# Patient Record
Sex: Female | Born: 1995 | Race: Black or African American | Hispanic: No | Marital: Single | Smoking: Never smoker
Health system: Southern US, Community
[De-identification: ages and names within clinical notes are randomized; demographics above are authoritative.]

## PROBLEM LIST (undated history)

## (undated) DIAGNOSIS — G43909 Migraine, unspecified, not intractable, without status migrainosus: Secondary | ICD-10-CM

---

## 2014-07-25 ENCOUNTER — Emergency Department: Payer: Self-pay | Admitting: Emergency Medicine

## 2016-10-01 ENCOUNTER — Emergency Department
Admission: EM | Admit: 2016-10-01 | Discharge: 2016-10-01 | Disposition: A | Discharge status: Home / Self Care | Payer: 59 | Attending: Emergency Medicine | Admitting: Emergency Medicine

## 2016-10-01 ENCOUNTER — Encounter: Payer: Self-pay | Admitting: Emergency Medicine

## 2016-10-01 DIAGNOSIS — M5431 Sciatica, right side: Secondary | ICD-10-CM | POA: Diagnosis not present

## 2016-10-01 DIAGNOSIS — M545 Low back pain: Secondary | ICD-10-CM | POA: Diagnosis present

## 2016-10-01 MED ORDER — PREDNISONE 10 MG (21) PO TBPK: ORAL_TABLET | ORAL | 0 refills | Status: DC

## 2016-10-01 MED ORDER — HYDROCODONE-ACETAMINOPHEN 5-325 MG PO TABS: 1.0000 | ORAL_TABLET | ORAL | 0 refills | Status: AC | PRN

## 2016-10-01 NOTE — ED Provider Notes (Signed)
[facility] Emergency Department Provider Note ____________________________________________  Time seen: Approximately 10:16 AM  I have reviewed the triage vital signs and the nursing notes.   HISTORY  Chief Complaint Back Pain and Leg Pain    HPI Diane Khan is a 21 y.o. female who presents to the emergency department for evaluation of pain to the right lower back that radiates down into the right leg. She denies any recent injury. She states that he she has been traveling long hours over the past several days. She's had no relief with Tylenol and ibuprofen. She denies symptoms similar to these in the past.  History reviewed. No pertinent past medical history.  There are no active problems to display for this patient.   History reviewed. No pertinent surgical history.  Prior to Admission medications   Medication Sig Start Date End Date Taking? Authorizing Provider  HYDROcodone-acetaminophen (NORCO/VICODIN) 5-325 MG tablet Take 1 tablet by mouth every 4 (four) hours as needed for moderate pain. 10/01/16 10/01/17  Rhyder Bratz, Kasandra Knudsen, FNP  predniSONE (STERAPRED UNI-PAK 21 TAB) 10 MG (21) TBPK tablet Take 6 tablets on day 1 Take 5 tablets on day 2 Take 4 tablets on day 3 Take 3 tablets on day 4 Take 2 tablets on day 5 Take 1 tablet on day 6 10/01/16   Emmy Keng B, FNP    Allergies Sulfa antibiotics  No family history on file.  Social History Social History  Substance Use Topics  . Smoking status: Never Smoker  . Smokeless tobacco: Never Used  . Alcohol use No    Review of Systems Constitutional: Uncomfortable appearing Respiratory: Negative for cough or shortness of breath. Musculoskeletal: Positive for pain in the right lower back. Skin: Negative for rash, lesion, or wound.  Neurological: Positive for radiation of pain into the right lower extremity.  ____________________________________________   PHYSICAL EXAM:  VITAL SIGNS: ED  Triage Vitals  Enc Vitals Group     BP      Pulse      Resp      Temp      Temp src      SpO2      Weight      Height      Head Circumference      Peak Flow      Pain Score      Pain Loc      Pain Edu?      Excl. in GC?     Constitutional: Alert and oriented. Well appearing and in no acute distress. Eyes: Conjunctiva clear without drainage or discharge.  Head: Atraumatic Neck: No stridor. Respiratory: Respirations are even and unlabored. Musculoskeletal: Positive straight leg raise on the right. No focal, midline tenderness of the lumbar spine. Neurologic: Radicular pain to the posterior aspect of the right leg to the knee.  Skin: Atraumatic.  Psychiatric: Normal affect and behavior.  ____________________________________________   LABS (all labs ordered are listed, but only abnormal results are displayed)  Labs Reviewed - No data to display ____________________________________________  RADIOLOGY  Not indicated. ____________________________________________   PROCEDURES  Procedure(s) performed: None  ____________________________________________   INITIAL IMPRESSION / ASSESSMENT AND PLAN / ED COURSE  Diane Khan is a 21 y.o. female who presents to the emergency department for evaluation of lower back pain that radiates into the right lower extremity. Symptoms are consistent with sciatica. She'll be treated with Norco and prednisone. She reportedly has some Flexeril at home. She was advised to follow-up  with her primary care provider for symptoms that are not improving over the next week or so. She was instructed to return to the emergency department for symptoms that change or worsen if she is unable schedule an appointment.  Pertinent labs & imaging results that were available during my care of the patient were reviewed by me and considered in my medical decision making (see chart for details).  _________________________________________   FINAL CLINICAL  IMPRESSION(S) / ED DIAGNOSES  Final diagnoses:  Sciatica of right side    Discharge Medication List as of 10/01/2016 10:26 AM    START taking these medications   Details  HYDROcodone-acetaminophen (NORCO/VICODIN) 5-325 MG tablet Take 1 tablet by mouth every 4 (four) hours as needed for moderate pain., Starting Wed 10/01/2016, Until Thu 10/01/2017, Print    predniSONE (STERAPRED UNI-PAK 21 TAB) 10 MG (21) TBPK tablet Take 6 tablets on day 1 Take 5 tablets on day 2 Take 4 tablets on day 3 Take 3 tablets on day 4 Take 2 tablets on day 5 Take 1 tablet on day 6, Print        If controlled substance prescribed during this visit, 12 month history viewed on the [facility] prior to issuing an initial prescription for Schedule II or III opiod.    Chinita Pesterriplett, Jed Kutch B, FNP 10/04/16 [id number]    Minna AntisPaduchowski, Kevin, MD 10/05/16 1416

## 2016-10-01 NOTE — ED Triage Notes (Signed)
States she developed right lower back several days ago  States pain radiates into right leg  Denies any trauma  But may have twisted wrong  Ambulates well to treatment room

## 2018-07-03 ENCOUNTER — Encounter: Payer: Self-pay | Admitting: Gynecology

## 2018-07-03 ENCOUNTER — Other Ambulatory Visit: Payer: Self-pay

## 2018-07-03 ENCOUNTER — Ambulatory Visit
Admission: EM | Admit: 2018-07-03 | Discharge: 2018-07-03 | Disposition: A | Discharge status: Home / Self Care | Payer: BLUE CROSS/BLUE SHIELD | Attending: Family Medicine | Admitting: Family Medicine

## 2018-07-03 DIAGNOSIS — L03116 Cellulitis of left lower limb: Secondary | ICD-10-CM

## 2018-07-03 DIAGNOSIS — B9689 Other specified bacterial agents as the cause of diseases classified elsewhere: Secondary | ICD-10-CM | POA: Diagnosis not present

## 2018-07-03 HISTORY — DX: Migraine, unspecified, not intractable, without status migrainosus: G43.909

## 2018-07-03 MED ORDER — DOXYCYCLINE HYCLATE 100 MG PO CAPS
100.0000 mg | ORAL_CAPSULE | Freq: Two times a day (BID) | ORAL | 0 refills | Status: DC
Start: 2018-07-03 — End: 2020-01-20

## 2018-07-03 MED ORDER — MUPIROCIN 2 % EX OINT: TOPICAL_OINTMENT | CUTANEOUS | 0 refills | Status: DC

## 2018-07-03 MED ORDER — PREDNISONE 20 MG PO TABS: 40.0000 mg | ORAL_TABLET | Freq: Every day | ORAL | 0 refills | Status: AC

## 2018-07-03 NOTE — ED Provider Notes (Signed)
[facility] ____________________________________________  Time seen: Approximately 3:49 PM  I have reviewed the triage vital signs and the nursing notes.   HISTORY  Chief Complaint No chief complaint on file.  HPI Diane Khan is a 23 y.o. female presenting for evaluation of suspected insect bite to left posterior calf.  States she noticed an itchy area to left calf 2 days ago, and states that she has been scratching the area and now noticed redness surrounding the bite.  States there is only tender somewhat to direct touch.  States the area has been somewhat oozing.  Has continued to scratch intermittently.  Has not tried any over-the-counter medications for the same complaints.  Denies other relieving factors.  Continues remain ambulatory.  Denies any leg swelling or difficulty walking.  No recent immobilization.  No fevers.  Denies other skin changes.  Reports otherwise doing well.  Patient's last menstrual period was 06/02/2018.  Denies pregnancy.  States continues oral contraceptives.  Tetanus immunization up-to-date.   Past Medical History:  Diagnosis Date  . Headache, migraine     There are no active problems to display for this patient.   History reviewed. No pertinent surgical history.   No current facility-administered medications for this encounter.   Current Outpatient Medications:  .  butalbital-acetaminophen-caffeine (FIORICET WITH CODEINE) 50-325-40-30 MG capsule, 1 tablet qid prn headache, Disp: , Rfl:  .  cyclobenzaprine (FLEXERIL) 10 MG tablet, 1 tab tid prn headache, Disp: , Rfl:  .  CYCLOBENZAPRINE HCL PO, , Disp: , Rfl:  .  hydrOXYzine (ATARAX/VISTARIL) 25 MG tablet, Take 1 tablet as needed before flight, Disp: , Rfl:  .  ibuprofen (ADVIL,MOTRIN) 800 MG tablet, , Disp: , Rfl:  .  SPRINTEC 28 0.25-35 MG-MCG tablet, , Disp: , Rfl:  .  doxycycline (VIBRAMYCIN) 100 MG capsule, Take 1 capsule (100 mg total) by mouth 2 (two) times daily., Disp: 20  capsule, Rfl: 0 .  mupirocin ointment (BACTROBAN) 2 %, Apply two times a day for 7 days., Disp: 22 g, Rfl: 0 .  predniSONE (DELTASONE) 20 MG tablet, Take 2 tablets (40 mg total) by mouth daily for 3 days., Disp: 6 tablet, Rfl: 0  Allergies Sulfa antibiotics  Family History  Problem Relation Age of Onset  . Cancer Mother   . Stroke Mother     Social History Social History   Tobacco Use  . Smoking status: Never Smoker  . Smokeless tobacco: Never Used  Substance Use Topics  . Alcohol use: Yes  . Drug use: No    Review of Systems Constitutional: No fever Cardiovascular: Denies chest pain. Respiratory: Denies shortness of breath. Gastrointestinal: No abdominal pain.   Musculoskeletal: Negative for back pain. Skin: positive for rash.   ____________________________________________   PHYSICAL EXAM:  VITAL SIGNS: ED Triage Vitals  Enc Vitals Group     BP 07/03/18 1459 (!) 149/69     Pulse -- 74     Resp 07/03/18 1459 18     Temp 07/03/18 1459 97.9 F (36.6 C)     Temp Source 07/03/18 1459 Oral     SpO2 07/03/18 1459 100 %     Weight 07/03/18 1500 (!) 340 lb (154.2 kg)     Height 07/03/18 1500 5\' 7"  (1.702 m)     Head Circumference --      Peak Flow --      Pain Score 07/03/18 1500 5     Pain Loc --      Pain Edu? --  Excl. in GC? --     Constitutional: Alert and oriented. Well appearing and in no acute distress. ENT      Head: Normocephalic and atraumatic. Cardiovascular: Normal rate, regular rhythm. Grossly normal heart sounds.  Good peripheral circulation. Respiratory: Normal respiratory effort without tachypnea nor retractions. Breath sounds are clear and equal bilaterally. No wheezes, rales, rhonchi. Musculoskeletal:  Steady gait. Negative Homan's sign to left leg. Bilateral pedal pulses equal and easily palpated. Neurologic:  Normal speech and language.Speech is normal. No gait instability.  Skin:  Skin is warm, dry.  Except: Left posterior medial  thigh 0.5 cm excoriated pruritic area with approximately 3 x 4 cm immediate surrounding mild erythema, mild clear oozing, minimal central induration, minimal tenderness at Center point, no other tenderness, Otherwise nontender, no edema. Psychiatric: Mood and affect are normal. Speech and behavior are normal. Patient exhibits appropriate insight and judgment   ___________________________________________   LABS (all labs ordered are listed, but only abnormal results are displayed)  Labs Reviewed  AEROBIC CULTURE (SUPERFICIAL SPECIMEN)     PROCEDURES Procedures    INITIAL IMPRESSION / ASSESSMENT AND PLAN / ED COURSE  Pertinent labs & imaging results that were available during my care of the patient were reviewed by me and considered in my medical decision making (see chart for details).  Overall well-appearing patient.  No acute distress.  Discussed possible insect bite, suspect cellulitis versus inflammation from direct scratching.  Will treat with oral doxycycline and 3-day prednisone.  Topical Bactroban.  Rest, monitor and avoid scratching.  Wound culture obtained.  Encourage supportive care.  Discussed very strict follow-up and return parameters for any pain, increased redness, leg edema or worsening concerns.Discussed indication, risks and benefits of medications with patient.  Discussed follow up with Primary care physician this week. Discussed follow up and return parameters including no resolution or any worsening concerns. Patient verbalized understanding and agreed to plan.   ____________________________________________   FINAL CLINICAL IMPRESSION(S) / ED DIAGNOSES  Final diagnoses:  Cellulitis of leg, left     ED Discharge Orders         Ordered    doxycycline (VIBRAMYCIN) 100 MG capsule  2 times daily     07/03/18 1544    mupirocin ointment (BACTROBAN) 2 %     07/03/18 1544    predniSONE (DELTASONE) 20 MG tablet  Daily     07/03/18 1544           Note:  This dictation was prepared with Dragon dictation along with smaller phrase technology. Any transcriptional errors that result from this process are unintentional.         Renford Dills, NP 07/03/18 1555

## 2018-07-03 NOTE — ED Triage Notes (Signed)
Patient c/ o rash at lower left Lower Leg x 2 days ago. Per patient insect bite. Pt. Stated redness and felt warm at area.

## 2018-07-03 NOTE — Discharge Instructions (Signed)
Take medication as prescribed. Avoid scratching. Monitor.  Follow up with your primary care physician this week as needed. Return to Urgent care for new or worsening concerns.   

## 2018-07-06 LAB — AEROBIC CULTURE  (SUPERFICIAL SPECIMEN)

## 2018-07-06 LAB — AEROBIC CULTURE W GRAM STAIN (SUPERFICIAL SPECIMEN): Culture: NORMAL

## 2020-01-20 ENCOUNTER — Encounter: Payer: Self-pay | Admitting: Emergency Medicine

## 2020-01-20 ENCOUNTER — Ambulatory Visit
Admission: EM | Admit: 2020-01-20 | Discharge: 2020-01-20 | Disposition: A | Discharge status: Home / Self Care | Payer: BC Managed Care – PPO | Attending: Internal Medicine | Admitting: Internal Medicine

## 2020-01-20 ENCOUNTER — Other Ambulatory Visit: Payer: Self-pay

## 2020-01-20 DIAGNOSIS — R442 Other hallucinations: Secondary | ICD-10-CM

## 2020-01-20 DIAGNOSIS — M62838 Other muscle spasm: Secondary | ICD-10-CM

## 2020-01-20 MED ORDER — METHOCARBAMOL 750 MG PO TABS: 750.0000 mg | ORAL_TABLET | Freq: Four times a day (QID) | ORAL | 0 refills | Status: AC

## 2020-01-20 MED ORDER — DICLOFENAC SODIUM 1 % EX GEL: 2.0000 g | Freq: Three times a day (TID) | CUTANEOUS | 0 refills | Status: AC

## 2020-01-20 NOTE — ED Triage Notes (Signed)
Patient c/o pain in her neck that started on Monday.  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  Patient also states that she has been having symptoms of where she periodiclly smell smoke.  Patient states that she can still smell other things and taste food fine.  Patient denies fevers.  Patient states that she had a COVID test done this week and is waiting on her result.

## 2020-01-20 NOTE — Discharge Instructions (Signed)
Follow with ear nose and throat doctor and the smoke smell persists and the dentist has not found anything wrong with your teeth.

## 2020-02-21 ENCOUNTER — Other Ambulatory Visit: Payer: Self-pay | Admitting: Physician Assistant

## 2020-02-21 DIAGNOSIS — J0140 Acute pansinusitis, unspecified: Secondary | ICD-10-CM

## 2020-03-15 ENCOUNTER — Other Ambulatory Visit: Payer: PRIVATE HEALTH INSURANCE

## 2020-04-02 ENCOUNTER — Ambulatory Visit
Admission: RE | Admit: 2020-04-02 | Discharge: 2020-04-02 | Disposition: A | Discharge status: Home / Self Care | Payer: PRIVATE HEALTH INSURANCE | Source: Ambulatory Visit | Attending: Physician Assistant | Admitting: Physician Assistant

## 2020-04-02 DIAGNOSIS — J0140 Acute pansinusitis, unspecified: Secondary | ICD-10-CM

## 2022-02-13 IMAGING — CT CT MAXILLOFACIAL W/O CM
3 of 5 series · 14 of 47 positions shown, 16 images · non-contrast
Comparison: None.

CLINICAL DATA: Headaches with abnormal smell and pain in the neck.
Pain behind the eyes.

EXAM:
CT MAXILLOFACIAL WITHOUT CONTRAST
TECHNIQUE: Multidetector CT imaging of the maxillofacial structures was
performed. Multiplanar CT image reconstructions were also generated.

[Series 4: sinus 2.00 hr60 s3 cor · coronal · 0.23mm/px · 3 of 94 slices shown]
[im 32/94  bone]
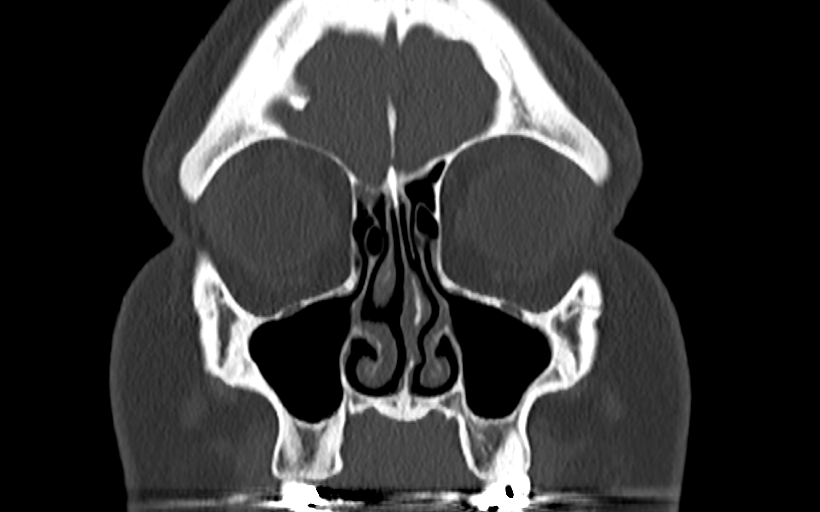
[im 42/94  bone]
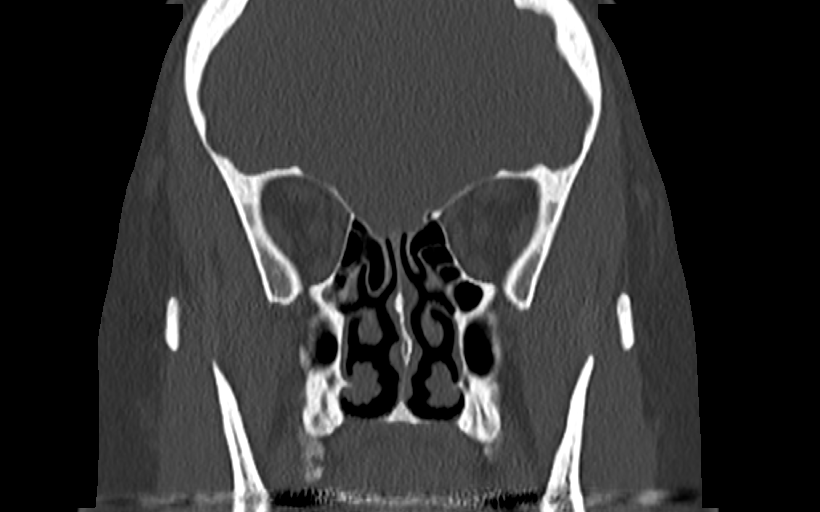
[im 52/94  bone]
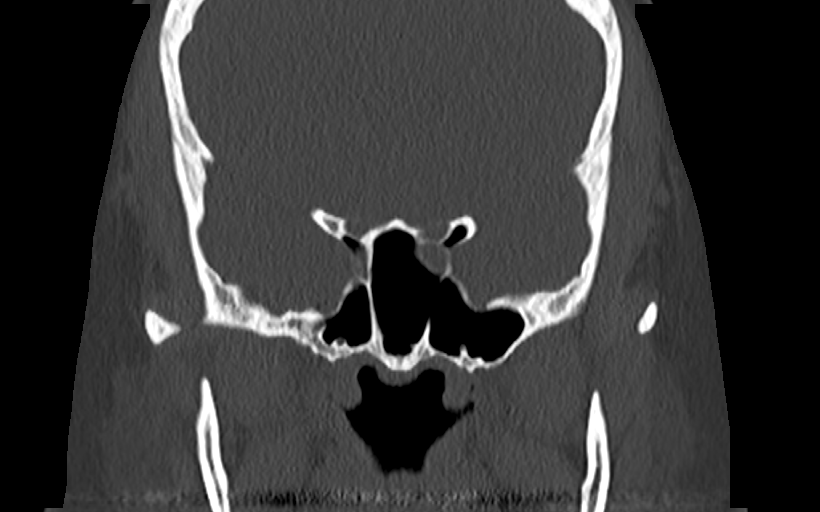

[Series 6: sinus 2.00 hr60 s3 sag · sagittal · 0.23mm/px · 3 of 94 slices shown]
[im 32/94  bone]
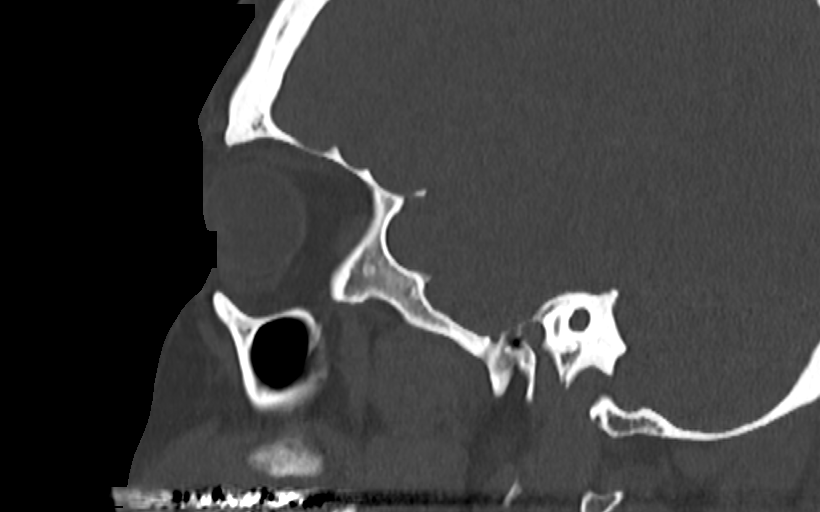
[im 47/94  bone]
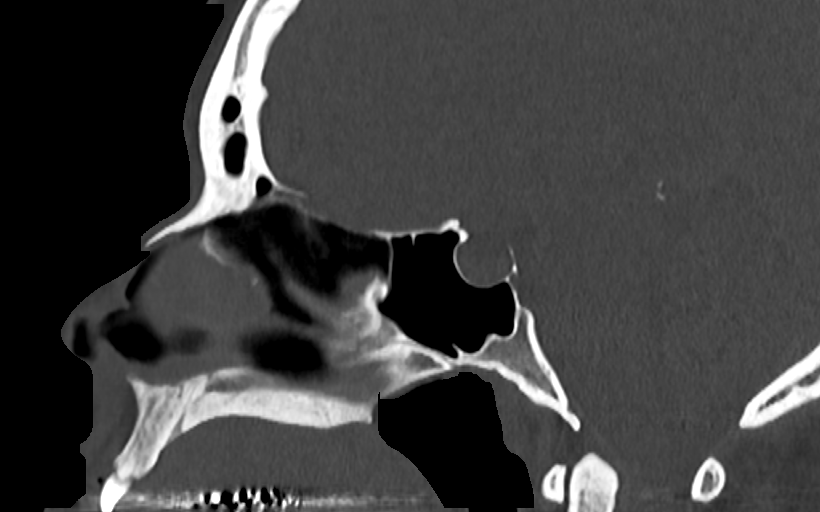
[im 63/94  bone]
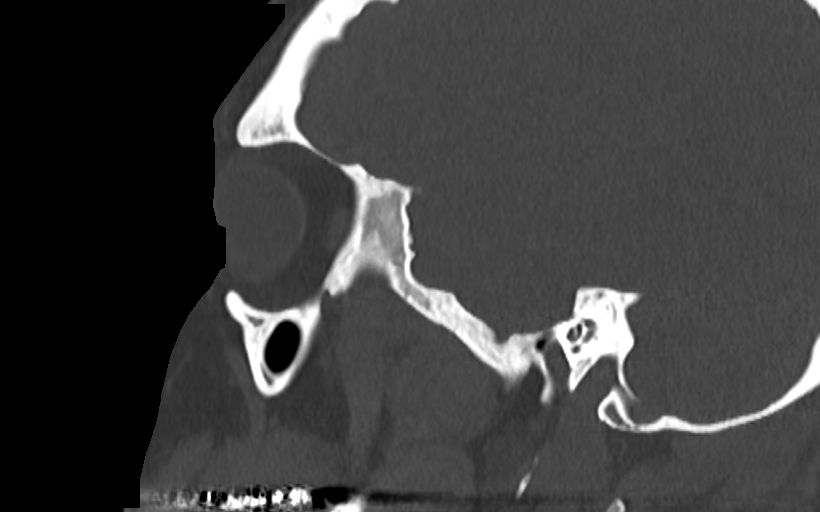

[Series 12: sinus 1.00 hr60 s3 axial fusion thins · axial · 0.37mm/px · z∈[-629,-538]mm · 8 of 195 slices shown, 10 images]
[im 22/195  brain]
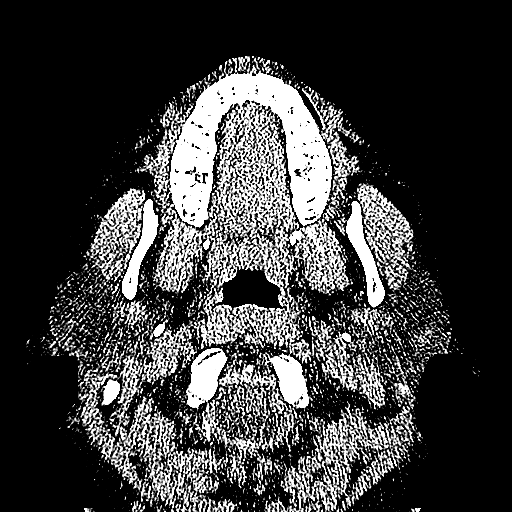
[im 22/195  bone]
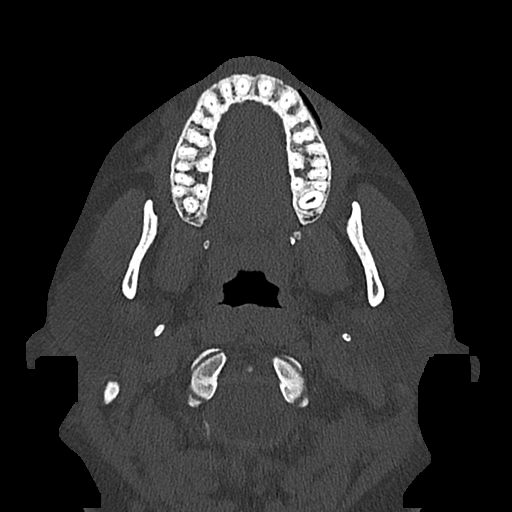
[im 44/195  bone]
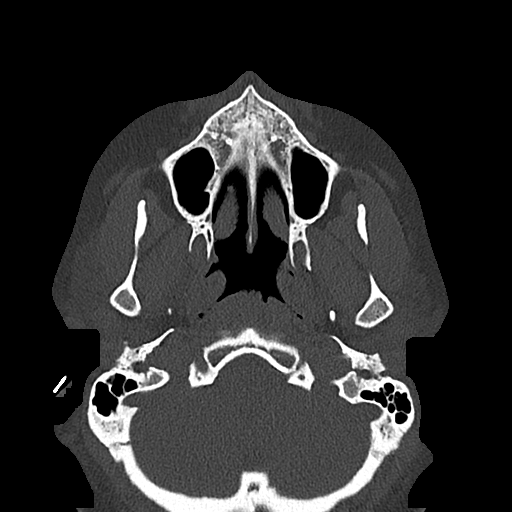
[im 65/195  bone]
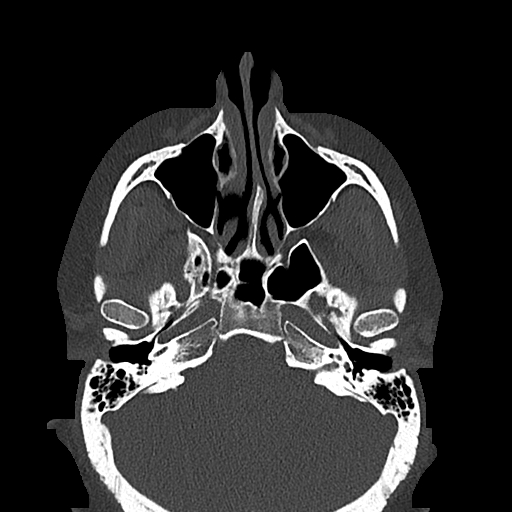
[im 87/195  bone]
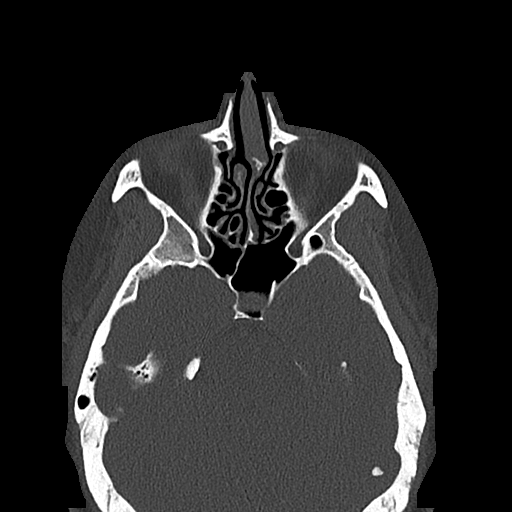
[im 108/195  brain]
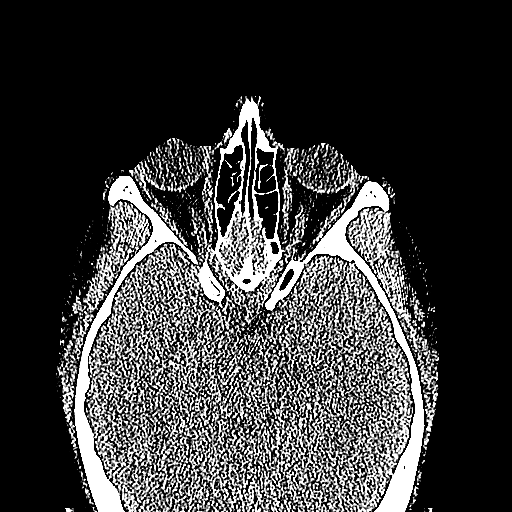
[im 108/195  bone]
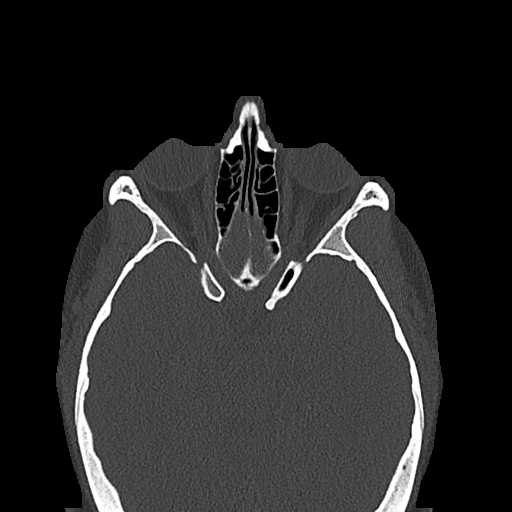
[im 130/195  bone]
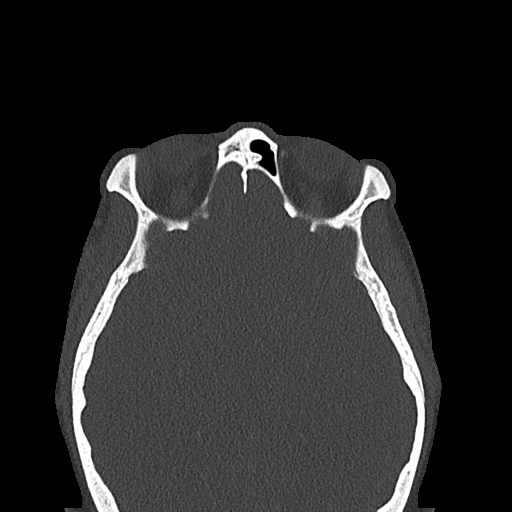
[im 151/195  bone]
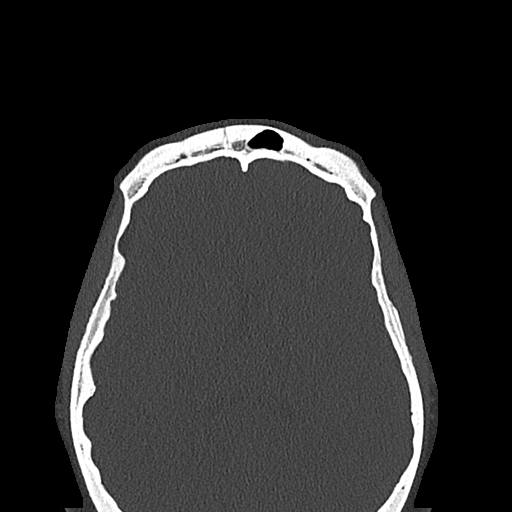
[im 173/195  bone]
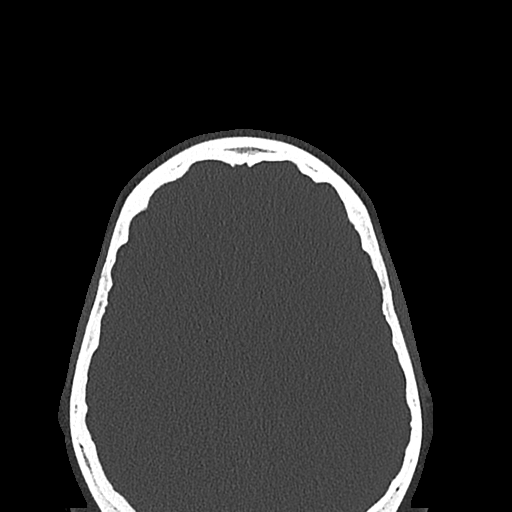

[14 of 47 positions shown; findings below may reference images not displayed]

FINDINGS: Osseous: Negative for fracture or bone lesion.  No TMJ spurring.

Orbits: Negative. No traumatic or inflammatory finding.

Sinuses: Negative for fluid level or sinus outflow obstruction.
Partially opacified right anterior ethmoid air cell with tiny area
of imperceptible fovea ethmoidalis in this region.

There is leftward septal deviation and spurring without turbinate
contact.

Soft tissues: Negative.  No evidence of inflammation or mass.

Limited intracranial: Scattered dural ossifications. There is a
calcification on the uppermost slice at the right frontal lobe which
is likely also dural based above the level of coverage.
IMPRESSION: 1. Partially opacified right ethmoid air cell with imperceptible
adjacent fovea ethmoidalis. Consider high-resolution MRI to exclude
cephalocele.
2. No active sinusitis.
